# Patient Record
Sex: Female | Born: 1975 | Race: White | Hispanic: No | Marital: Married | Smoking: Never smoker
Health system: Southern US, Community
[De-identification: ages and names within clinical notes are randomized; demographics above are authoritative.]

---

## 2009-02-04 ENCOUNTER — Observation Stay: Payer: Self-pay

## 2009-02-14 ENCOUNTER — Inpatient Hospital Stay: Payer: Self-pay

## 2009-02-26 ENCOUNTER — Ambulatory Visit: Payer: Self-pay

## 2009-05-28 ENCOUNTER — Ambulatory Visit: Payer: Self-pay | Admitting: Family Medicine

## 2012-05-19 ENCOUNTER — Ambulatory Visit: Payer: Self-pay

## 2013-09-27 ENCOUNTER — Ambulatory Visit: Payer: Self-pay | Admitting: Family Medicine

## 2013-12-08 ENCOUNTER — Ambulatory Visit: Payer: Self-pay

## 2013-12-08 LAB — CBC WITH DIFFERENTIAL/PLATELET
Basophil #: 0 10*3/uL (ref 0.0–0.1)
Basophil %: 0.4 %
EOS ABS: 0.1 10*3/uL (ref 0.0–0.7)
Eosinophil %: 1.7 %
HCT: 36.7 % (ref 35.0–47.0)
HGB: 12.2 g/dL (ref 12.0–16.0)
Lymphocyte #: 1.7 10*3/uL (ref 1.0–3.6)
Lymphocyte %: 26.8 %
MCH: 30.4 pg (ref 26.0–34.0)
MCHC: 33.3 g/dL (ref 32.0–36.0)
MCV: 91 fL (ref 80–100)
Monocyte #: 0.5 x10 3/mm (ref 0.2–0.9)
Monocyte %: 8.2 %
Neutrophil #: 3.9 10*3/uL (ref 1.4–6.5)
Neutrophil %: 62.9 %
Platelet: 161 10*3/uL (ref 150–440)
RBC: 4.03 10*6/uL (ref 3.80–5.20)
RDW: 13 % (ref 11.5–14.5)
WBC: 6.2 10*3/uL (ref 3.6–11.0)

## 2013-12-08 LAB — COMPREHENSIVE METABOLIC PANEL
ALT: 19 U/L (ref 12–78)
ANION GAP: 4 — AB (ref 7–16)
Albumin: 3.6 g/dL (ref 3.4–5.0)
Alkaline Phosphatase: 53 U/L
BUN: 14 mg/dL (ref 7–18)
Bilirubin,Total: 0.4 mg/dL (ref 0.2–1.0)
CALCIUM: 9 mg/dL (ref 8.5–10.1)
CHLORIDE: 105 mmol/L (ref 98–107)
CREATININE: 0.87 mg/dL (ref 0.60–1.30)
Co2: 29 mmol/L (ref 21–32)
EGFR (African American): >60
GLUCOSE: 90 mg/dL (ref 65–99)
OSMOLALITY: 276 (ref 275–301)
Potassium: 3.9 mmol/L (ref 3.5–5.1)
SGOT(AST): 16 U/L (ref 15–37)
SODIUM: 138 mmol/L (ref 136–145)
Total Protein: 6.6 g/dL (ref 6.4–8.2)

## 2013-12-08 LAB — URINALYSIS, COMPLETE
Bilirubin,UR: NEGATIVE
Blood: NEGATIVE
Glucose,UR: NEGATIVE mg/dL (ref 0–75)
KETONE: NEGATIVE
NITRITE: NEGATIVE
PH: 6 (ref 4.5–8.0)
Protein: NEGATIVE
SPECIFIC GRAVITY: 1.01 (ref 1.003–1.030)

## 2013-12-08 LAB — LIPASE, BLOOD: LIPASE: 149 U/L (ref 73–393)

## 2013-12-08 LAB — AMYLASE: Amylase: 54 U/L (ref 25–115)

## 2013-12-10 LAB — URINE CULTURE

## 2013-12-12 ENCOUNTER — Ambulatory Visit: Payer: Self-pay

## 2013-12-14 IMAGING — US US EXTREM LOW VENOUS*R*
1 series · 14 of 24 positions shown · non-contrast
Comparison: none

REASON FOR EXAM: CALL REPORT [DATE] Right calf pain Eval for DVT
COMMENTS:

[Series 1: us extrem low venous*right* · 0.07mm/px · 14 of 30 slices shown]
[im 1/30]
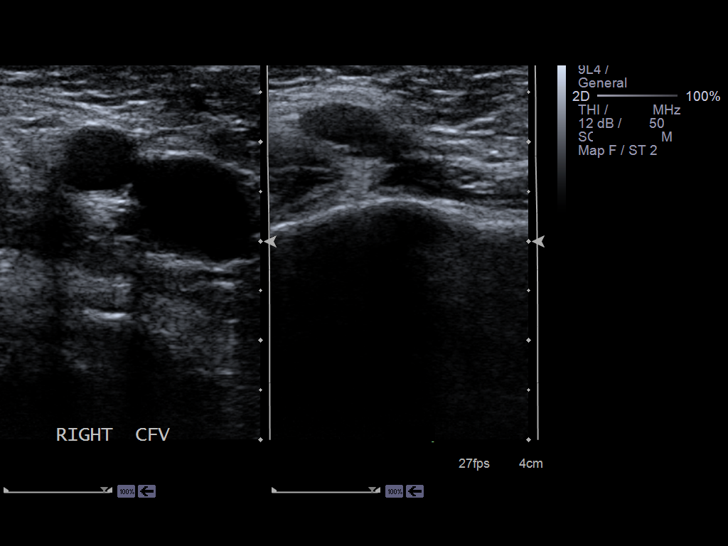
[im 3/30]
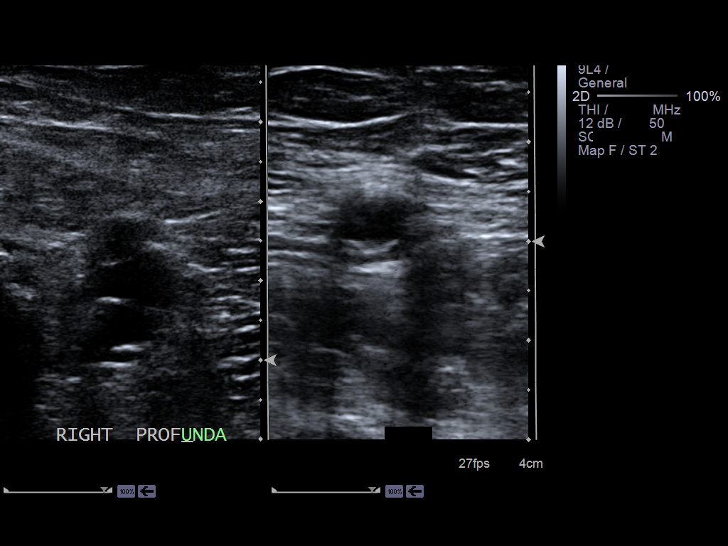
[im 6/30]
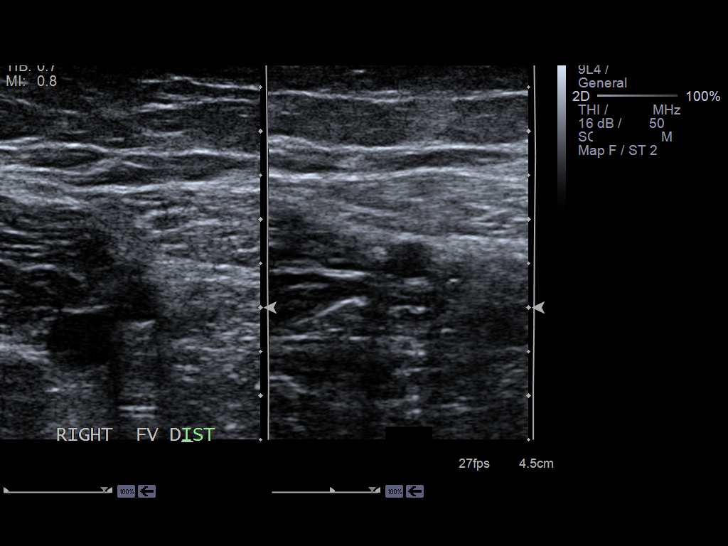
[im 8/30]
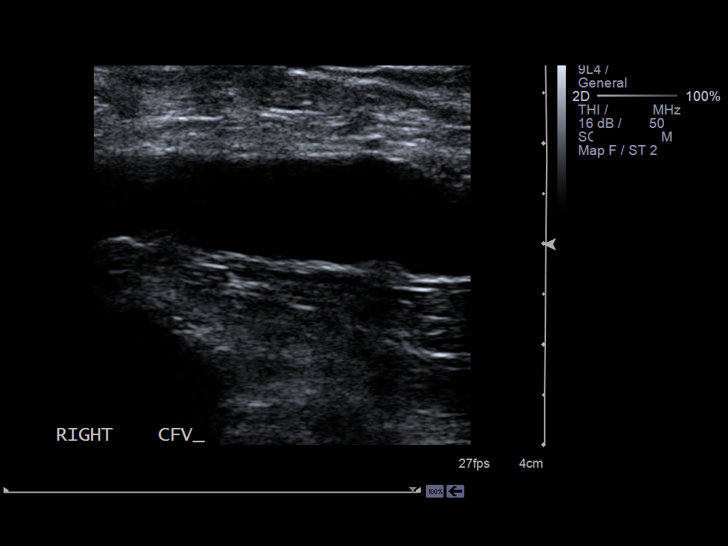
[im 9/30]
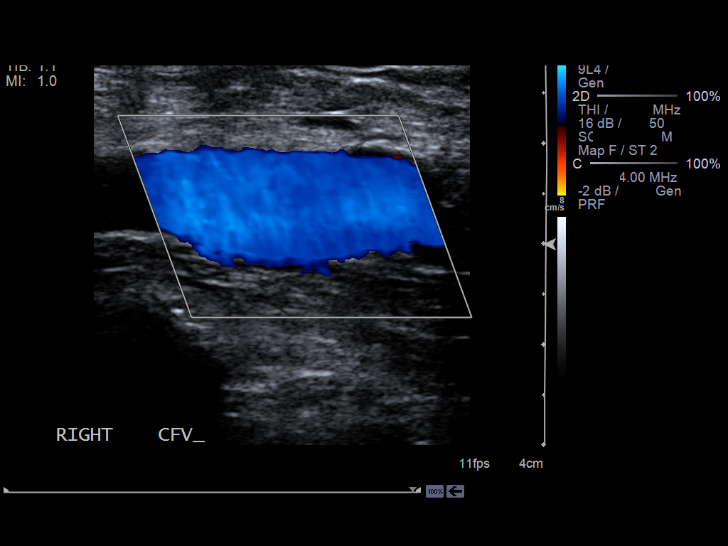
[im 12/30]
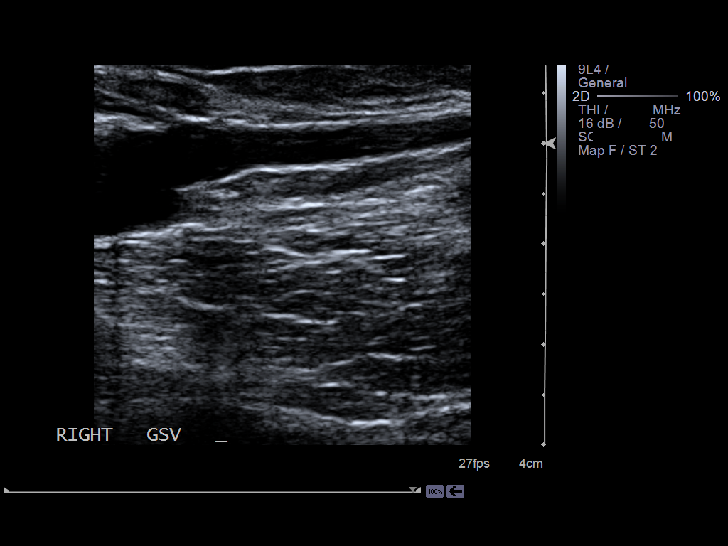
[im 14/30]
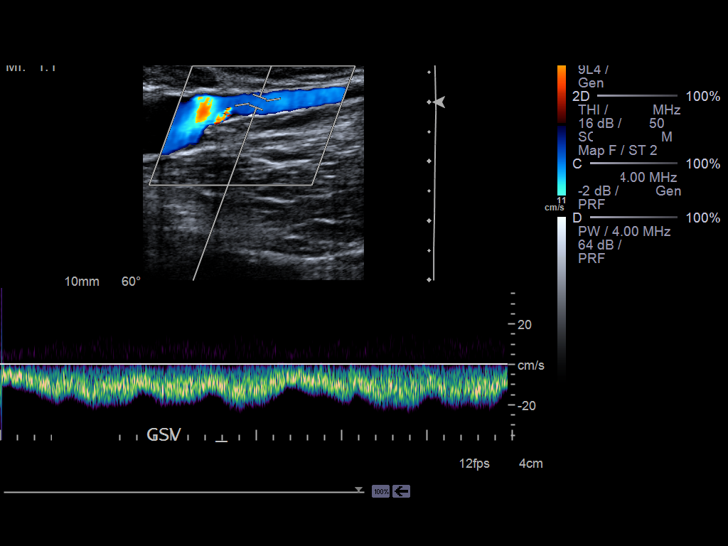
[im 16/30]
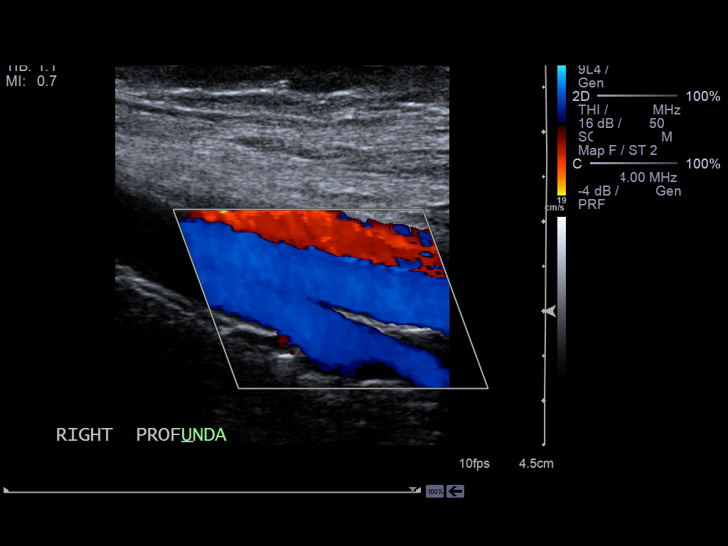
[im 18/30]
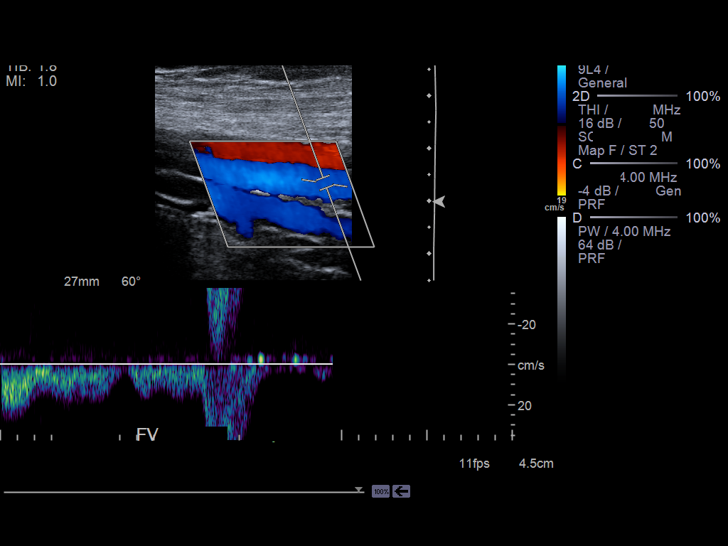
[im 21/30]
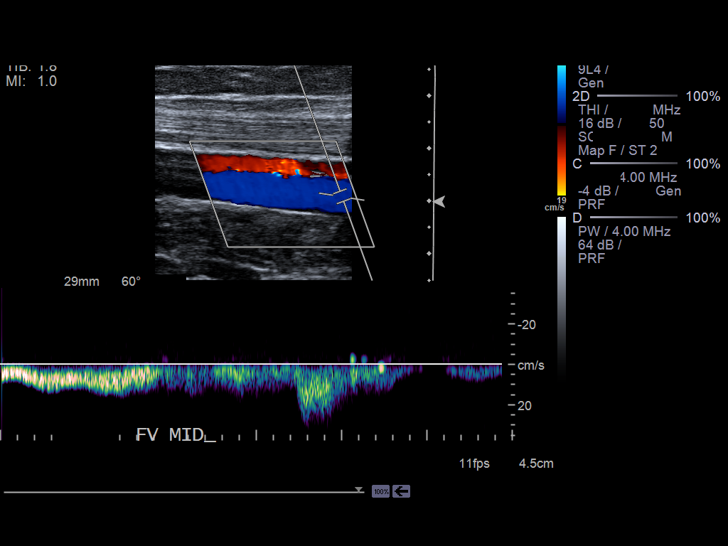
[im 23/30]
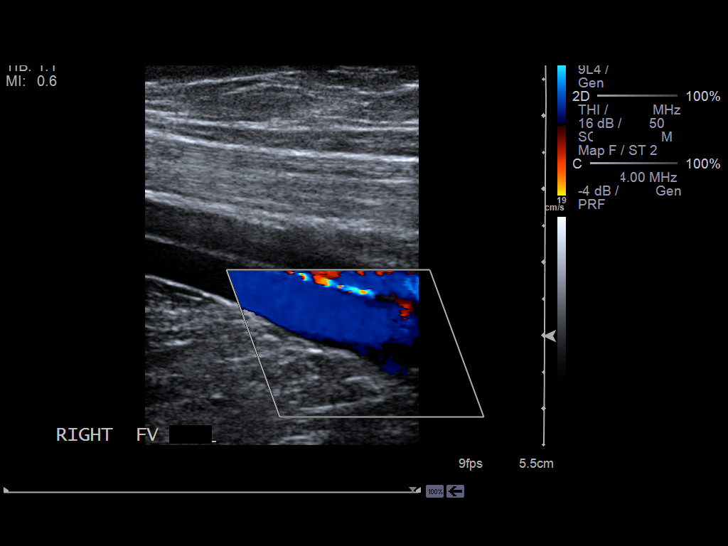
[im 24/30]
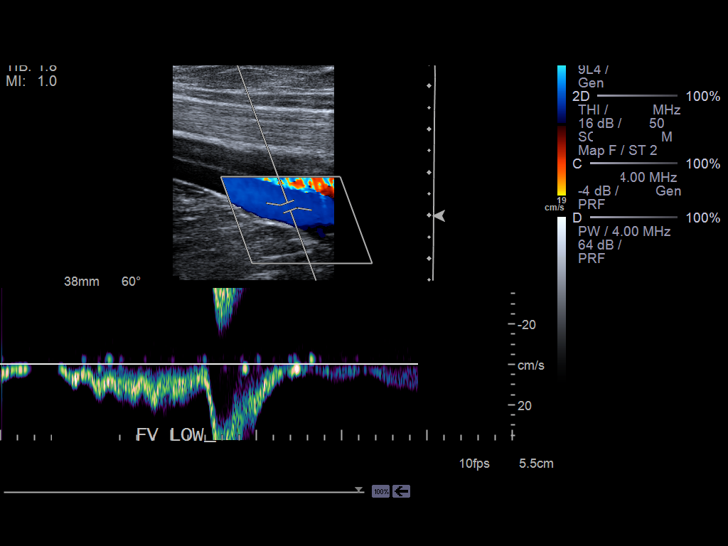
[im 27/30]
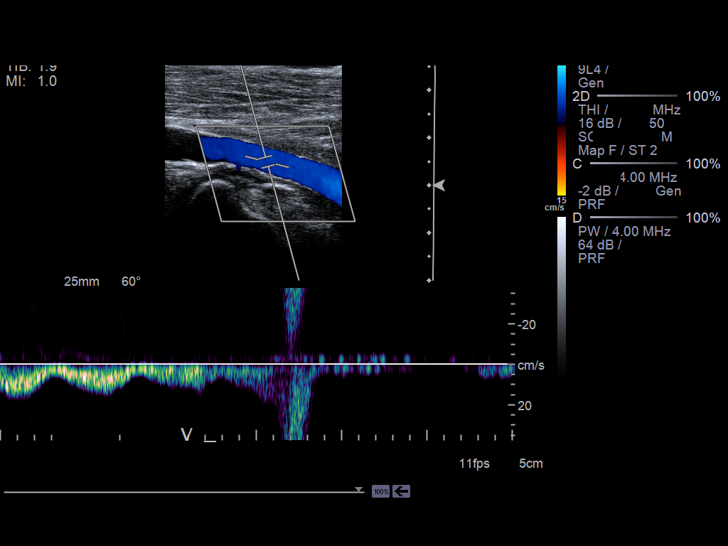
[im 30/30]
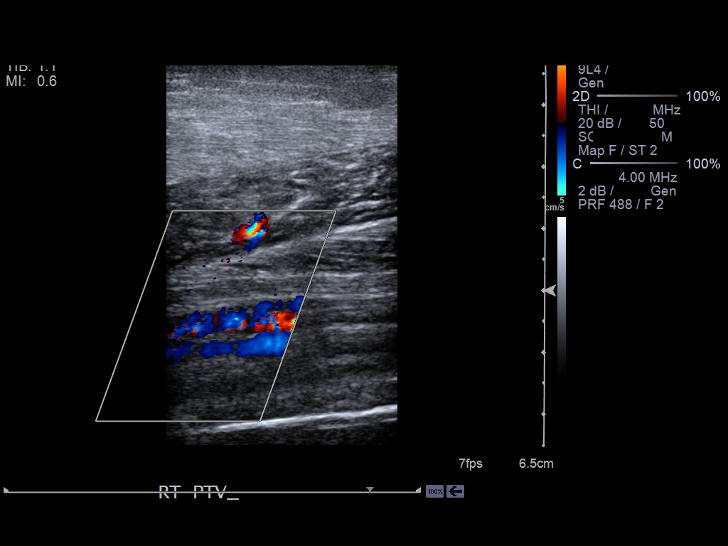

[14 of 24 positions shown; findings below may reference images not displayed]

PROCEDURE:     US  - US DOPPLER LOW EXTR RIGHT  - May 19, 2012  [DATE]

RESULT:     Doppler interrogation of the deep venous system of the right leg
is performed from the common femoral vein through the popliteal vein. Study
demonstrates normal compressibility of the deep venous structures. The color
Doppler and spectral Doppler appearance is normal.
IMPRESSION: 1. No evidence of right lower extremity deep vein thrombosis.

[REDACTED]

## 2015-07-09 IMAGING — US ABDOMEN ULTRASOUND LIMITED
1 series · 14 of 25 positions shown · non-contrast
Comparison: None.

CLINICAL DATA: Abdominal pain, right flank pain, evaluate
gallbladder and liver

EXAM:
US ABDOMEN LIMITED - RIGHT UPPER QUADRANT

[Series 1: abdomen ultrasound limited · 0.33mm/px · 14 of 59 slices shown]
[im 1/59]
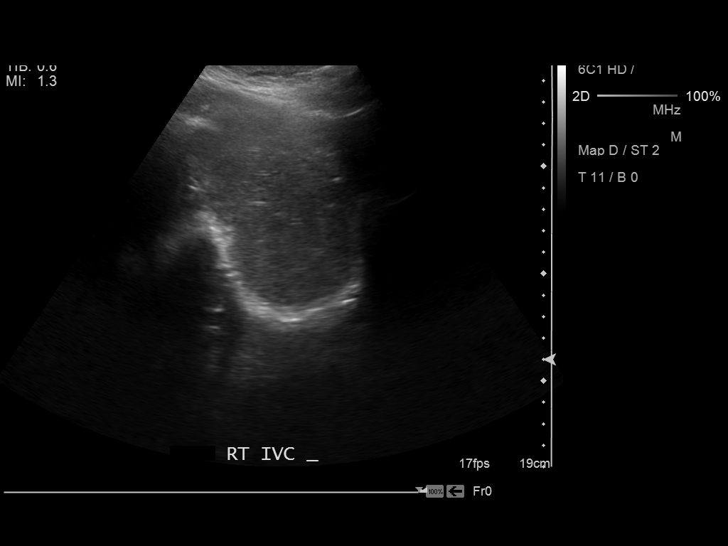
[im 5/59]
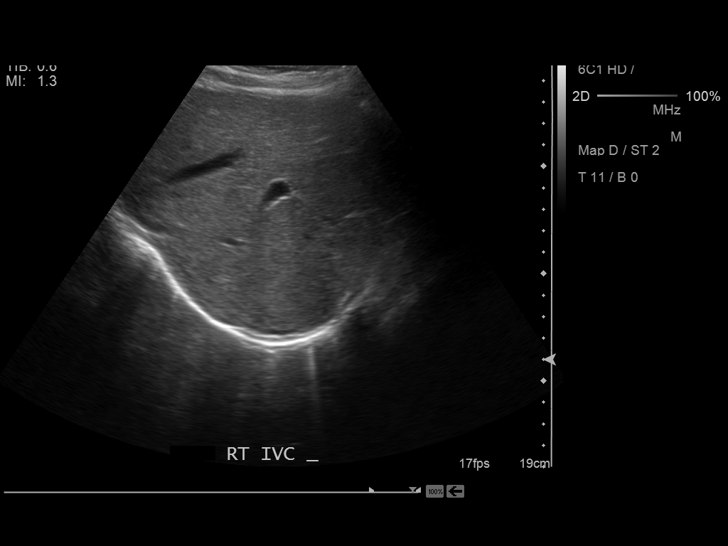
[im 10/59]
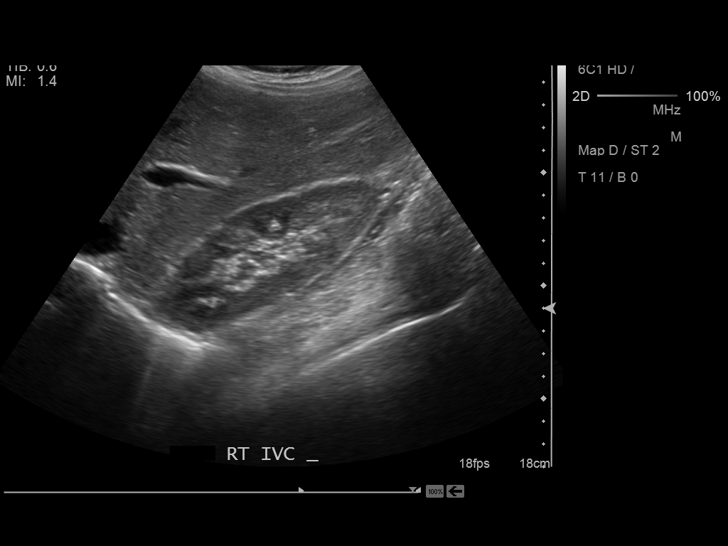
[im 15/59]
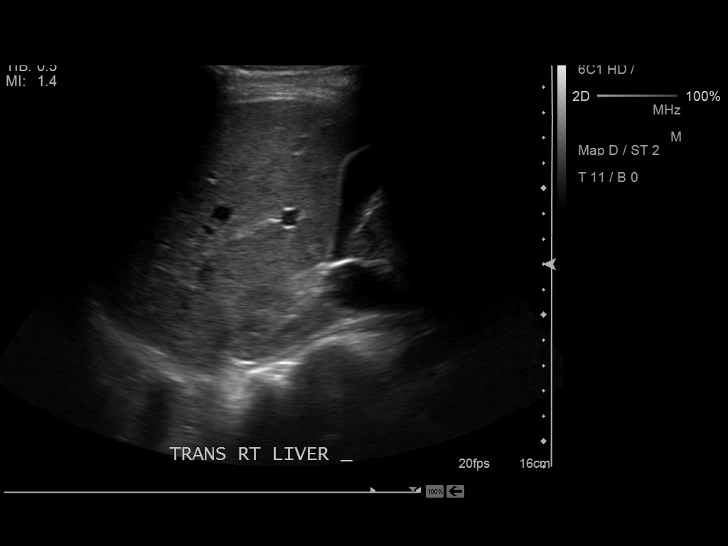
[im 20/59]
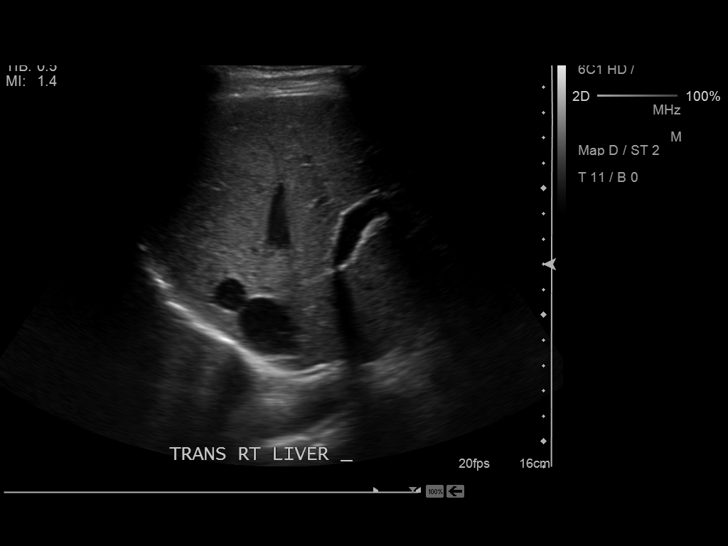
[im 22/59]
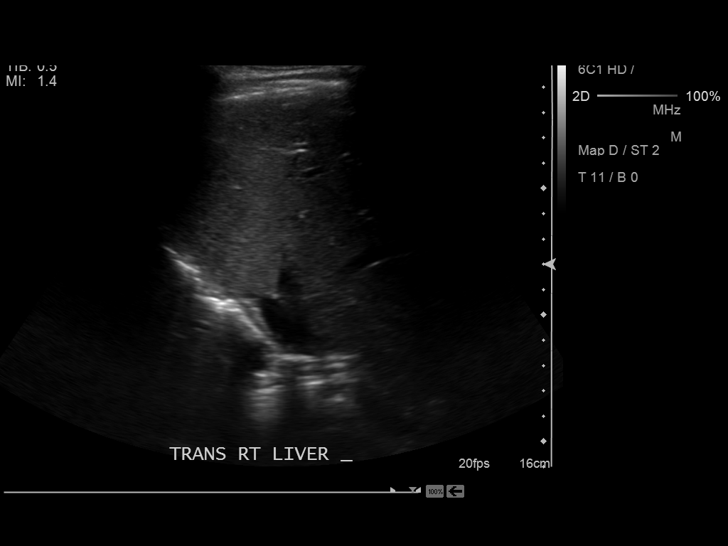
[im 27/59]
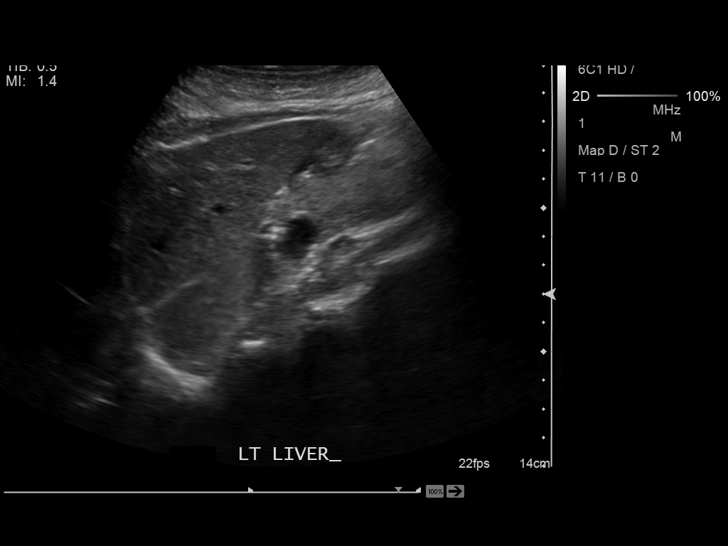
[im 32/59]
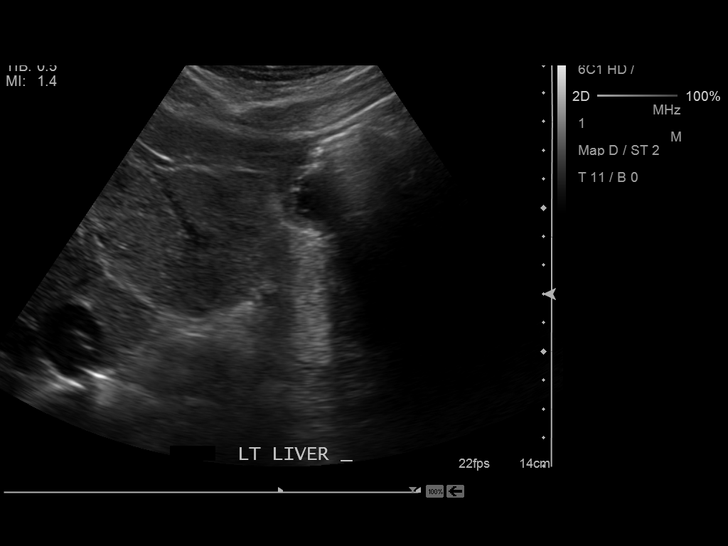
[im 37/59]
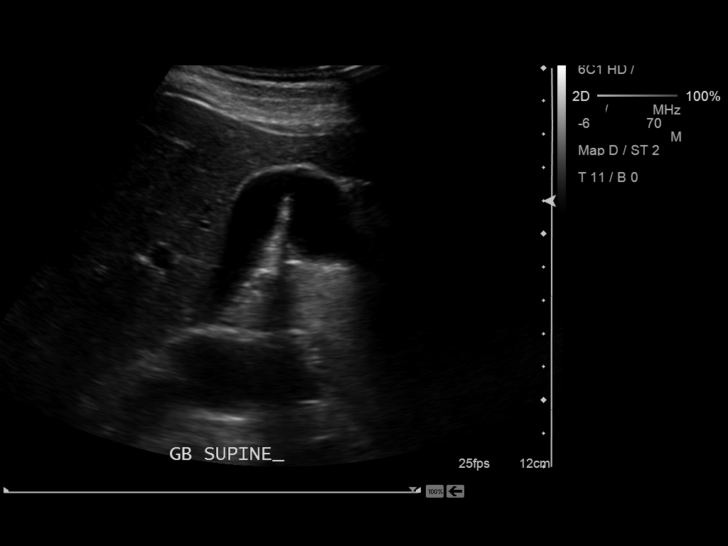
[im 39/59]
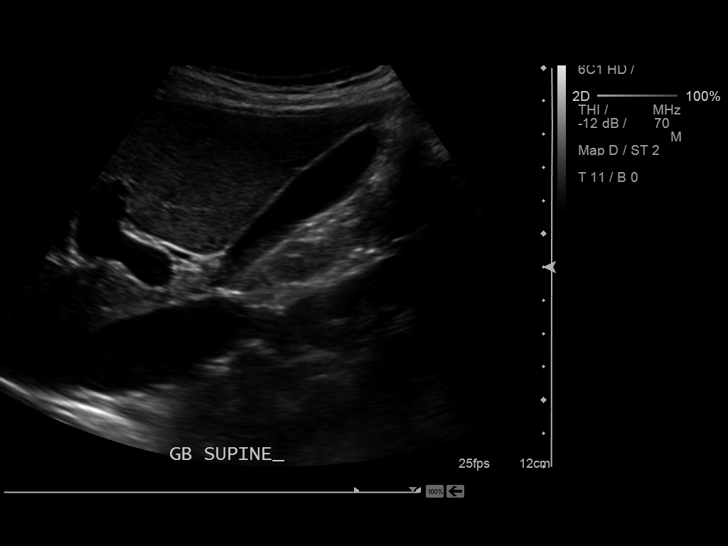
[im 44/59]
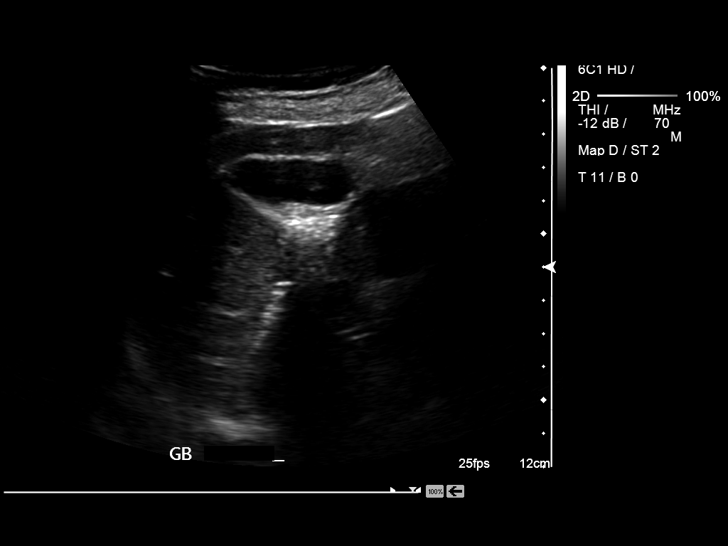
[im 49/59]
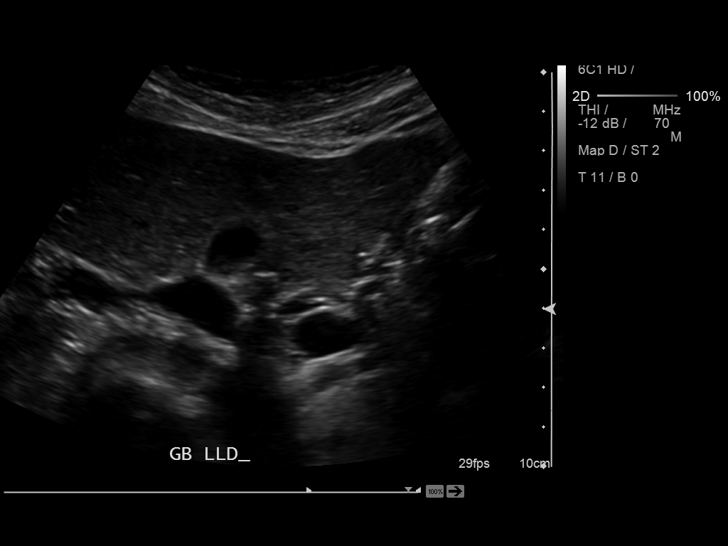
[im 54/59]
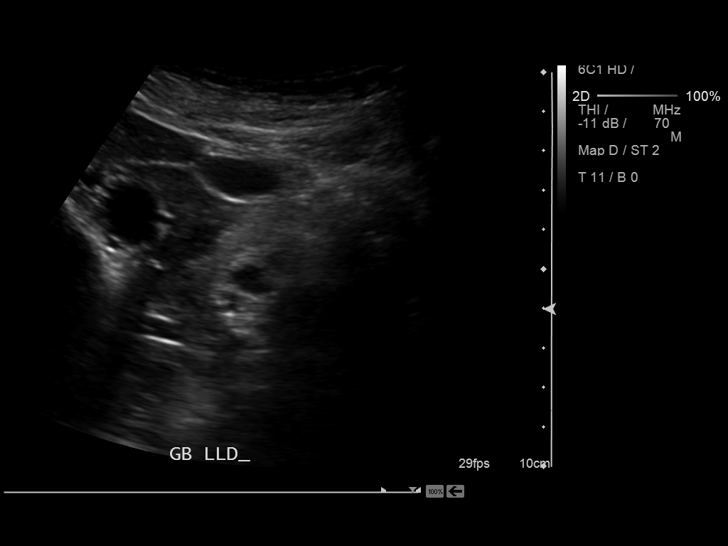
[im 59/59]
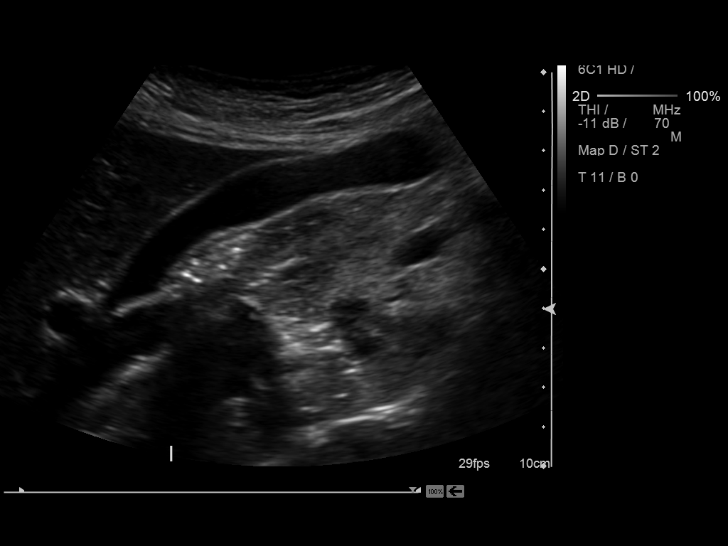

[14 of 25 positions shown; findings below may reference images not displayed]

FINDINGS: Gallbladder:

No gallstones or wall thickening visualized. No sonographic Murphy
sign noted.

Common bile duct:

Diameter: 5 mm in diameter within normal limits.

Liver:

No focal lesion identified. Within normal limits in parenchymal
echogenicity.
IMPRESSION: Unremarkable right upper quadrant ultrasound.

## 2016-06-11 ENCOUNTER — Ambulatory Visit
Admission: EM | Admit: 2016-06-11 | Discharge: 2016-06-11 | Disposition: A | Discharge status: Home / Self Care | Payer: Managed Care, Other (non HMO) | Attending: Emergency Medicine | Admitting: Emergency Medicine

## 2016-06-11 ENCOUNTER — Encounter: Payer: Self-pay | Admitting: Emergency Medicine

## 2016-06-11 DIAGNOSIS — J0100 Acute maxillary sinusitis, unspecified: Secondary | ICD-10-CM

## 2016-06-11 DIAGNOSIS — R0989 Other specified symptoms and signs involving the circulatory and respiratory systems: Secondary | ICD-10-CM | POA: Diagnosis not present

## 2016-06-11 DIAGNOSIS — H9202 Otalgia, left ear: Secondary | ICD-10-CM | POA: Diagnosis not present

## 2016-06-11 DIAGNOSIS — R079 Chest pain, unspecified: Secondary | ICD-10-CM | POA: Diagnosis not present

## 2016-06-11 DIAGNOSIS — R0981 Nasal congestion: Secondary | ICD-10-CM | POA: Diagnosis not present

## 2016-06-11 DIAGNOSIS — M26623 Arthralgia of bilateral temporomandibular joint: Secondary | ICD-10-CM

## 2016-06-11 DIAGNOSIS — R0789 Other chest pain: Secondary | ICD-10-CM

## 2016-06-11 MED ORDER — CYCLOBENZAPRINE HCL 10 MG PO TABS: 10.0000 mg | ORAL_TABLET | Freq: Every day | ORAL | 0 refills | Status: AC

## 2016-06-11 MED ORDER — MOMETASONE FUROATE 50 MCG/ACT NA SUSP: 2.0000 | Freq: Every day | NASAL | 0 refills | Status: AC

## 2016-06-11 MED ORDER — NAPROXEN 500 MG PO TABS: 500.0000 mg | ORAL_TABLET | Freq: Two times a day (BID) | ORAL | 0 refills | Status: AC

## 2016-06-11 NOTE — Discharge Instructions (Signed)
Chest pain: You can try some Pepcid or some Protonix in case this is acid reflux. Go immediately to the ER for a return of your symptoms.  URI/sinusitis; Mucinex D, saline nasal irrigation, Naprosyn 500 mg with 1 g of Tylenol twice a day. This will also help with your temporomandibular joint. This is an effective combination for pain.  Most sinus infections are viral and do not need antibiotics unless you have a high fever, have had this for 10 days, or you get better and then get sick again. Use a neti pot or the NeilMed sinus rinse as often as you want to to reduce nasal congestion. Follow the directions on the box.   TMJ pain: Soft diet for the next few days. Do not chew gum or eat foods that require a lot of chewing. You can try the Flexeril at night.

## 2016-06-11 NOTE — ED Triage Notes (Signed)
Patient c/o runny nose, sinus congestion and left ear pain for the past 2 days.  Patient denies fevers.

## 2016-06-11 NOTE — ED Provider Notes (Signed)
HPI  SUBJECTIVE:  Katherine Zamora is a 40 y.o. female who presents with 2 complaints. First, she reports an episode of chest pain described as pressure with nausea and diaphoresis 5 days ago. Last approximately hour and then resolves on its own after she lay down. She denies any exertional positional component to this. No abdominal pain, syncope, palpitations. No radiation to her neck, through to her back, down her arm. States that she ate at a restaurant earlier that night, but did not have anything unusual. She states that she has had intermittent substernal nonradiating chest pressure that lasts minutes since. No nausea, diaphoresis, exertional component. No change in physical activity. No trauma to her chest. She reports a mild cough, no wheezing or shortness of breath. There are no aggravating or alleviating factors. She has not tried anything for this.  Second, she reports constant bilateral jaw pain, sinus pressure, nasal congestion, rhinorrhea, postnasal drip, frontal sinus headache and a burning pain in her throat for the past 3 days. She denies fevers, dental pain, wheezing, shortness of breath. No sore throat. No belching, water brash. She has tried hot lemon water, essential oils, ibuprofen 400 mg earlier today. Ibuprofen helped. Symptoms are worse with lying down. She did not get a flu shot this year. No sick contacts. His past medical history strep, Kawasaki disease, GERD and pregnancy. No history of MI, hypertension, hypercholesterolemia, diabetes, PE, DVT, coronary artery disease. She is nonsmoker. She is not any birth control pills. LMP: 11/9. Denies possibility of being pregnant. Family history: Father drowned at age 38 but they found a 90% LAD occlusion on autopsy. It is unknown if he had an MI. PMD: She will be establishing care at [facility] in 2 days.  History reviewed. No pertinent past medical history.  History reviewed. No pertinent surgical history.  History reviewed.  No pertinent family history.  Social History  Substance Use Topics  . Smoking status: Never Smoker  . Smokeless tobacco: Never Used  . Alcohol use No    No current facility-administered medications for this encounter.   Current Outpatient Prescriptions:  .  cyclobenzaprine (FLEXERIL) 10 MG tablet, Take 1 tablet (10 mg total) by mouth at bedtime., Disp: 20 tablet, Rfl: 0 .  mometasone (NASONEX) 50 MCG/ACT nasal spray, Place 2 sprays into the nose daily., Disp: 17 g, Rfl: 0 .  naproxen (NAPROSYN) 500 MG tablet, Take 1 tablet (500 mg total) by mouth 2 (two) times daily., Disp: 20 tablet, Rfl: 0  No Known Allergies   ROS  As noted in HPI.   Physical Exam  BP 117/89 (BP Location: Left Arm)   Pulse 83   Temp 98 F (36.7 C) (Oral)   Resp 16   Ht 5\' 9"  (1.753 m)   Wt 128 lb (58.1 kg)   LMP 05/13/2016 (Exact Date)   SpO2 100%   BMI 18.90 kg/m   Constitutional: Well developed, well nourished, no acute distress Eyes: PERRL, EOMI, conjunctiva normal bilaterally HENT: Normocephalic, atraumatic,mucus membranes moist. TMs, external ear canals normal bilaterally. Positive bilateral TMJ tenderness with crepitus. Positive nasal congestion. Positive frontal sinus tenderness, mild. Normal turbinates. Dentition normal. Positive cobblestoning. No obvious postnasal drip. Respiratory: Clear to auscultation bilaterally, no rales, no wheezing, no rhonchi Cardiovascular: Normal rate and rhythm, no murmurs, no gallops, no rubs. No chest wall tenderness GI: nondistended, skin: No rash, skin intact Musculoskeletal: Calves symmetric, nontender No edema, no tenderness, no deformities Neurologic: Alert & oriented x 3, CN II-XII grossly intact, no  motor deficits, sensation grossly intact Psychiatric: Speech and behavior appropriate   ED Course   Medications - No data to display  Orders Placed This Encounter  Procedures  . ED EKG    Standing Status:   Standing    Number of Occurrences:   1     Order Specific Question:   Reason for Exam    Answer:   Chest Pain  . EKG 12-Lead    Standing Status:   Standing    Number of Occurrences:   1   No results found for this or any previous visit (from the past 24 hour(s)). No results found.  ED Clinical Impression  Acute maxillary sinusitis, recurrence not specified  Chest pressure  Bilateral temporomandibular joint pain   ED Assessment/Plan   EKG: Normal sinus rhythm, rate 85. Normal axis, normal intervals. No hypertrophy. No ST-T wave changes. No previous EKG for comparison. Patient was asymptomatic while this was obtained.  Concern for cardiac etiology of her chest pain given her history of Kawasaki disease, however, as she has no EKG changes here and is asymptomatic at this point in time, feel that she is stable to do an outpatient cardiac workup. This could be simple reflux given that she does have a history of GERD in pregnancy.  [facility]. Giving patient strict ER chest pain return precautions. She is to go immediately to the ER for any recurrence of her symptoms.  Second, presentation consistent with URI/viral sinusitis. Feel that the ear pain and jaw pain is most likely from TMJ arthralgia given that she has tenderness and crepitus, and is not an anginal equivalent . She is to do a soft diet for the next few days. Flexeril at night.   She does have some frontal sinus tenderness and nasal congestion, but in the absence of fevers, upper dental pain, early nasal drainage, there are no indications for antibiotics. We'll send home with Nasonex, saline nasal irrigation, ibuprofen 600 mg with 1 g of Tylenol 4 times a day.   Discussed MDM, plan and followup with patient. Discussed sn/sx that should prompt return to the ED. Patient agrees with plan.   Meds ordered this encounter  Medications  . cyclobenzaprine (FLEXERIL) 10 MG tablet    Sig: Take 1 tablet (10 mg total) by mouth at  bedtime.    Dispense:  20 tablet    Refill:  0  . naproxen (NAPROSYN) 500 MG tablet    Sig: Take 1 tablet (500 mg total) by mouth 2 (two) times daily.    Dispense:  20 tablet    Refill:  0  . mometasone (NASONEX) 50 MCG/ACT nasal spray    Sig: Place 2 sprays into the nose daily.    Dispense:  17 g    Refill:  0    *This clinic note was created using Scientist, clinical (histocompatibility and immunogenetics)Dragon dictation software. Therefore, there may be occasional mistakes despite careful proofreading.  ?   Domenick GongAshley Tamika Shropshire, MD 06/17/16 1007

## 2016-06-14 ENCOUNTER — Telehealth: Payer: Self-pay

## 2016-06-14 NOTE — Telephone Encounter (Signed)
Courtesy call back completed today for patient's recent visit at [facility]. Patient did not answer, left message on machine to call back with any questions or concerns.   

## 2022-02-09 ENCOUNTER — Other Ambulatory Visit: Payer: Self-pay

## 2022-02-09 ENCOUNTER — Emergency Department: Payer: Managed Care, Other (non HMO)

## 2022-02-09 ENCOUNTER — Emergency Department
Admission: EM | Admit: 2022-02-09 | Discharge: 2022-02-09 | Disposition: A | Discharge status: Home / Self Care | Payer: Managed Care, Other (non HMO) | Attending: Emergency Medicine | Admitting: Emergency Medicine

## 2022-02-09 DIAGNOSIS — R109 Unspecified abdominal pain: Secondary | ICD-10-CM | POA: Diagnosis present

## 2022-02-09 DIAGNOSIS — N132 Hydronephrosis with renal and ureteral calculous obstruction: Secondary | ICD-10-CM | POA: Diagnosis not present

## 2022-02-09 DIAGNOSIS — N200 Calculus of kidney: Secondary | ICD-10-CM

## 2022-02-09 LAB — CBC
HCT: 40.8 % (ref 36.0–46.0)
Hemoglobin: 13.7 g/dL (ref 12.0–15.0)
MCH: 30.2 pg (ref 26.0–34.0)
MCHC: 33.6 g/dL (ref 30.0–36.0)
MCV: 90.1 fL (ref 80.0–100.0)
Platelets: 238 10*3/uL (ref 150–400)
RBC: 4.53 MIL/uL (ref 3.87–5.11)
RDW: 12.1 % (ref 11.5–15.5)
WBC: 7.2 10*3/uL (ref 4.0–10.5)
nRBC: 0 % (ref 0.0–0.2)

## 2022-02-09 LAB — URINALYSIS, ROUTINE W REFLEX MICROSCOPIC
Bilirubin Urine: NEGATIVE
Glucose, UA: NEGATIVE mg/dL
Ketones, ur: 20 mg/dL — AB
Leukocytes,Ua: NEGATIVE
Nitrite: NEGATIVE
Protein, ur: NEGATIVE mg/dL
Specific Gravity, Urine: 1.023 (ref 1.005–1.030)
pH: 5 (ref 5.0–8.0)

## 2022-02-09 LAB — BASIC METABOLIC PANEL
Anion gap: 8 (ref 5–15)
BUN: 15 mg/dL (ref 6–20)
CO2: 22 mmol/L (ref 22–32)
Calcium: 9.5 mg/dL (ref 8.9–10.3)
Chloride: 107 mmol/L (ref 98–111)
Creatinine, Ser: 1.03 mg/dL — ABNORMAL HIGH (ref 0.44–1.00)
GFR, Estimated: >60 mL/min (ref >60)
Glucose, Bld: 118 mg/dL — ABNORMAL HIGH (ref 70–99)
Potassium: 3.6 mmol/L (ref 3.5–5.1)
Sodium: 137 mmol/L (ref 135–145)

## 2022-02-09 LAB — HCG, QUANTITATIVE, PREGNANCY: hCG, Beta Chain, Quant, S: 1 m[IU]/mL (ref <5)

## 2022-02-09 MED ORDER — ONDANSETRON HCL 4 MG PO TABS: 4.0000 mg | ORAL_TABLET | Freq: Three times a day (TID) | ORAL | 0 refills | Status: AC | PRN

## 2022-02-09 MED ORDER — TAMSULOSIN HCL 0.4 MG PO CAPS: 0.4000 mg | ORAL_CAPSULE | Freq: Every day | ORAL | 0 refills | Status: AC

## 2022-02-09 MED ORDER — KETOROLAC TROMETHAMINE 10 MG PO TABS: 10.0000 mg | ORAL_TABLET | Freq: Three times a day (TID) | ORAL | 0 refills | Status: AC | PRN

## 2022-02-09 MED ORDER — KETOROLAC TROMETHAMINE 15 MG/ML IJ SOLN
15.0000 mg | Freq: Once | INTRAMUSCULAR | Status: AC
  Administered 2022-02-09: 15 mg via INTRAVENOUS
  Filled 2022-02-09: qty 1

## 2022-02-09 MED ORDER — FENTANYL CITRATE PF 50 MCG/ML IJ SOSY
50.0000 ug | PREFILLED_SYRINGE | Freq: Once | INTRAMUSCULAR | Status: AC
  Administered 2022-02-09: 50 ug via INTRAVENOUS
  Filled 2022-02-09: qty 1

## 2022-02-09 MED ORDER — MORPHINE SULFATE (PF) 4 MG/ML IV SOLN
4.0000 mg | Freq: Once | INTRAVENOUS | Status: AC
  Administered 2022-02-09: 4 mg via INTRAVENOUS
  Filled 2022-02-09: qty 1

## 2022-02-09 MED ORDER — SODIUM CHLORIDE 0.9 % IV BOLUS
1000.0000 mL | Freq: Once | INTRAVENOUS | Status: AC
  Administered 2022-02-09: 1000 mL via INTRAVENOUS

## 2022-02-09 MED ORDER — ONDANSETRON HCL 4 MG/2ML IJ SOLN
4.0000 mg | Freq: Once | INTRAMUSCULAR | Status: AC
  Administered 2022-02-09: 4 mg via INTRAVENOUS
  Filled 2022-02-09: qty 2

## 2022-02-09 MED ORDER — TAMSULOSIN HCL 0.4 MG PO CAPS
0.4000 mg | ORAL_CAPSULE | Freq: Once | ORAL | Status: AC
  Administered 2022-02-09: 0.4 mg via ORAL
  Filled 2022-02-09: qty 1

## 2022-02-09 NOTE — Discharge Instructions (Signed)
Please seek medical attention for any high fevers, chest pain, shortness of breath, change in behavior, persistent vomiting, bloody stool or any other new or concerning symptoms.  

## 2022-02-09 NOTE — ED Notes (Signed)
Bladder scan result: 3 ml.

## 2022-02-09 NOTE — ED Provider Notes (Signed)
[facility] Provider Note    Event Date/Time   First MD Initiated Contact with Patient 02/09/22 1942     (approximate)   History   Flank Pain   HPI  Katherine Zamora is a 46 y.o. female  who presents to the emergency department today because of concern for urgency, right flank pain. Symptoms started today. Have progressively gotten worse. The pain is severe. Both burning and stabbing in nature. The patient denies similar symptoms in the past.  She has also developed nausea and vomiting.   Physical Exam   Triage Vital Signs: ED Triage Vitals  Enc Vitals Group     BP 02/09/22 1921 136/75     Pulse Rate 02/09/22 1921 (!) 55     Resp 02/09/22 1921 20     Temp 02/09/22 1921 (!) 97.5 F (36.4 C)     Temp Source 02/09/22 1921 Oral     SpO2 02/09/22 1921 99 %     Weight 02/09/22 1923 138 lb (62.6 kg)     Height 02/09/22 1923 5\' 9"  (1.753 m)     Head Circumference --      Peak Flow --      Pain Score 02/09/22 1922 8   Most recent vital signs: Vitals:   02/09/22 1921  BP: 136/75  Pulse: (!) 55  Resp: 20  Temp: (!) 97.5 F (36.4 C)  SpO2: 99%    General: Awake, alert, oriented. CV:  Good peripheral perfusion.  Resp:  Normal effort.  Abd:  No distention. Non tender.    ED Results / Procedures / Treatments   Labs (all labs ordered are listed, but only abnormal results are displayed) Labs Reviewed  URINALYSIS, ROUTINE W REFLEX MICROSCOPIC - Abnormal; Notable for the following components:      Result Value   Color, Urine YELLOW (*)    APPearance CLOUDY (*)    Hgb urine dipstick MODERATE (*)    Ketones, ur 20 (*)    Bacteria, UA RARE (*)    All other components within normal limits  BASIC METABOLIC PANEL - Abnormal; Notable for the following components:   Glucose, Bld 118 (*)    Creatinine, Ser 1.03 (*)    All other components within normal limits  CBC  HCG, QUANTITATIVE, PREGNANCY  POC URINE PREG, ED      EKG  None   RADIOLOGY I independently interpreted and visualized the ct renal. My interpretation: kidney stone at right uvj Radiology interpretation:  IMPRESSION:  1. Obstructive 3.6 mm calculus at the right vesicoureteral junction,  likely within the intravesicular portion of the right terminal  ureter, with moderate right hydronephrosis and hydroureter.  2. Small hiatal hernia.  3. Increased attenuation of the gallbladder may represent sludge.     PROCEDURES:  Critical Care performed: No  Procedures   MEDICATIONS ORDERED IN ED: Medications - No data to display   IMPRESSION / MDM / ASSESSMENT AND PLAN / ED COURSE  I reviewed the triage vital signs and the nursing notes.                              Differential diagnosis includes, but is not limited to, kidney stone, appendicitis, urinary tract infection  Patient's presentation is most consistent with acute presentation with potential threat to life or bodily function.  Patient presented to the emergency department today because of concerns for right flank pain.  Patient's abdomen  is benign.  Given clinical history and patient's appearance did have concerns for kidney stone.  CT scan was obtained which did show a stone at the right UVJ.  Urine without findings concerning for infection.  I did discuss the findings with the patient.  She did have significant pain relief with IV fluids and medication here in the emergency department.  We will plan on discharging with prescription for pain medication nausea medication and Flomax.  Discussed return precautions.  FINAL CLINICAL IMPRESSION(S) / ED DIAGNOSES   Final diagnoses:  Kidney stone     Note:  This document was prepared using Dragon voice recognition software and may include unintentional dictation errors.    Phineas Semen, MD 02/09/22 [phone]

## 2022-02-09 NOTE — ED Triage Notes (Signed)
Patient reports she has been unable to urinate all day and reports sudden onset of right flank pain that began shortly before arriving to ED. Denies fever, reports chills. AOX4. Resp even, unlabored on RA, ambulatory.

## 2022-03-21 ENCOUNTER — Ambulatory Visit
Admission: RE | Admit: 2022-03-21 | Discharge: 2022-03-21 | Disposition: A | Discharge status: Home / Self Care | Payer: Managed Care, Other (non HMO) | Source: Ambulatory Visit | Attending: Family

## 2022-03-21 ENCOUNTER — Ambulatory Visit
Admission: RE | Admit: 2022-03-21 | Discharge: 2022-03-21 | Disposition: A | Discharge status: Home / Self Care | Payer: Managed Care, Other (non HMO) | Attending: Family | Admitting: Family

## 2022-03-21 ENCOUNTER — Other Ambulatory Visit: Payer: Self-pay | Admitting: Family

## 2022-03-21 DIAGNOSIS — R051 Acute cough: Secondary | ICD-10-CM | POA: Diagnosis present

## 2024-06-06 DIAGNOSIS — R10A2 Flank pain, left side: Secondary | ICD-10-CM | POA: Insufficient documentation

## 2024-06-06 DIAGNOSIS — R10A1 Flank pain, right side: Secondary | ICD-10-CM | POA: Diagnosis present

## 2024-06-06 DIAGNOSIS — R1031 Right lower quadrant pain: Secondary | ICD-10-CM | POA: Insufficient documentation

## 2024-06-07 ENCOUNTER — Emergency Department

## 2024-06-07 ENCOUNTER — Emergency Department: Admission: EM | Admit: 2024-06-07 | Discharge: 2024-06-07 | Disposition: A | Payer: Self-pay

## 2024-06-07 ENCOUNTER — Other Ambulatory Visit: Payer: Self-pay

## 2024-06-07 DIAGNOSIS — R10A2 Flank pain, left side: Secondary | ICD-10-CM

## 2024-06-07 LAB — URINALYSIS, ROUTINE W REFLEX MICROSCOPIC
Bilirubin Urine: NEGATIVE
Glucose, UA: NEGATIVE mg/dL
Ketones, ur: 5 mg/dL — AB
Leukocytes,Ua: NEGATIVE
Nitrite: NEGATIVE
Protein, ur: 30 mg/dL — AB
RBC / HPF: >50 RBC/hpf (ref 0–5)
Specific Gravity, Urine: 1.031 — ABNORMAL HIGH (ref 1.005–1.030)
pH: 5 (ref 5.0–8.0)

## 2024-06-07 LAB — CBC WITH DIFFERENTIAL/PLATELET
Abs Immature Granulocytes: 0.01 K/uL (ref 0.00–0.07)
Basophils Absolute: 0 K/uL (ref 0.0–0.1)
Basophils Relative: 0 %
Eosinophils Absolute: 0.3 K/uL (ref 0.0–0.5)
Eosinophils Relative: 3 %
HCT: 36.9 % (ref 36.0–46.0)
Hemoglobin: 12.4 g/dL (ref 12.0–15.0)
Immature Granulocytes: 0 %
Lymphocytes Relative: 32 %
Lymphs Abs: 2.3 K/uL (ref 0.7–4.0)
MCH: 30.2 pg (ref 26.0–34.0)
MCHC: 33.6 g/dL (ref 30.0–36.0)
MCV: 90 fL (ref 80.0–100.0)
Monocytes Absolute: 0.7 K/uL (ref 0.1–1.0)
Monocytes Relative: 9 %
Neutro Abs: 4 K/uL (ref 1.7–7.7)
Neutrophils Relative %: 56 %
Platelets: 215 K/uL (ref 150–400)
RBC: 4.1 MIL/uL (ref 3.87–5.11)
RDW: 13.1 % (ref 11.5–15.5)
WBC: 7.3 K/uL (ref 4.0–10.5)
nRBC: 0 % (ref 0.0–0.2)

## 2024-06-07 LAB — BASIC METABOLIC PANEL WITH GFR
Anion gap: 9 (ref 5–15)
BUN: 18 mg/dL (ref 6–20)
CO2: 25 mmol/L (ref 22–32)
Calcium: 9.2 mg/dL (ref 8.9–10.3)
Chloride: 109 mmol/L (ref 98–111)
Creatinine, Ser: 0.93 mg/dL (ref 0.44–1.00)
GFR, Estimated: >60 mL/min (ref >60)
Glucose, Bld: 110 mg/dL — ABNORMAL HIGH (ref 70–99)
Potassium: 4 mmol/L (ref 3.5–5.1)
Sodium: 143 mmol/L (ref 135–145)

## 2024-06-07 LAB — POC URINE PREG, ED: Preg Test, Ur: NEGATIVE

## 2024-06-07 MED ORDER — SODIUM CHLORIDE 0.9 % IV BOLUS
1000.0000 mL | Freq: Once | INTRAVENOUS | Status: AC
  Administered 2024-06-07: 1000 mL via INTRAVENOUS

## 2024-06-07 MED ORDER — KETOROLAC TROMETHAMINE 15 MG/ML IJ SOLN
15.0000 mg | Freq: Once | INTRAMUSCULAR | Status: AC
  Administered 2024-06-07: 15 mg via INTRAVENOUS
  Filled 2024-06-07: qty 1

## 2024-06-07 MED ORDER — ONDANSETRON 4 MG PO TBDP: 4.0000 mg | ORAL_TABLET | Freq: Three times a day (TID) | ORAL | 0 refills | Status: AC | PRN

## 2024-06-07 MED ORDER — ONDANSETRON HCL 4 MG/2ML IJ SOLN
4.0000 mg | Freq: Once | INTRAMUSCULAR | Status: AC
  Administered 2024-06-07: 4 mg via INTRAVENOUS
  Filled 2024-06-07: qty 2

## 2024-06-07 MED ORDER — OXYCODONE-ACETAMINOPHEN 5-325 MG PO TABS
1.0000 | ORAL_TABLET | Freq: Once | ORAL | Status: AC
  Administered 2024-06-07: 1 via ORAL
  Filled 2024-06-07: qty 1

## 2024-06-07 MED ORDER — OXYCODONE HCL 5 MG PO TABS: 5.0000 mg | ORAL_TABLET | Freq: Three times a day (TID) | ORAL | 0 refills | Status: AC | PRN

## 2024-06-07 NOTE — ED Notes (Signed)
Second urine specimen sent to the lab.  

## 2024-06-07 NOTE — Discharge Instructions (Addendum)
 You were seen today due to concern of back pain, I suspect you likely have passed a kidney stone which is resulting in your symptoms.  You may continue to feel intermittent spasms over the next few hours, but I suspect your symptoms should improve.  I have written for some nausea medication and some pain medication for you to take at home, please take these as instructed.  Please avoid driving or operating any heavy machinery while taking these medications.  If you have any worsening of symptoms such as uncontrolled pain uncontrolled nausea, fevers, or any other symptoms you find concerning please return to the emergency department immediately for further medical management

## 2024-06-07 NOTE — ED Triage Notes (Signed)
 Pt reports right side flank pain that began earlier today, pt denies dysuria. Pt was seen at [facility] earlier today and given abx and pain meds. Pt has hx kidney stones.

## 2024-06-07 NOTE — ED Notes (Signed)
 Iv started  meds given.

## 2024-06-07 NOTE — ED Notes (Signed)
 AVS provided by edp was reviewed with the pt. Prescriptions were reviewed. Pharmacy was verified by pt. Pt verbalized understanding with no additional questions at this time. Pt going home with daughter at bedside.

## 2024-06-07 NOTE — ED Provider Notes (Signed)
 [facility] Provider Note    Event Date/Time   First MD Initiated Contact with Patient 06/07/24 [phone]     (approximate)   History   Flank Pain   HPI  Katherine Zamora is a 48 y.o. female who presents today with concern of flank pain.  Developed right-sided flank pain earlier today, she went to urgent care was found with blood in her urine she was discharged home with an antibiotic and after she got a shot of some sort of pain medication.  She went home and went to sleep, when she woke up she was having excruciating pain along her right flank causing her to present to the emergency department.  She has had a kidney stone in the past about 2 years ago, she does not remember if this is what it felt like or not but she feels miserable right now.  Denies any other medical history no surgical history.     Physical Exam   Triage Vital Signs: ED Triage Vitals  Encounter Vitals Group     BP 06/07/24 0007 (!) 145/83     Girls Systolic BP Percentile --      Girls Diastolic BP Percentile --      Boys Systolic BP Percentile --      Boys Diastolic BP Percentile --      Pulse Rate 06/07/24 0007 79     Resp 06/07/24 0007 20     Temp 06/07/24 0007 98.1 F (36.7 C)     Temp src --      SpO2 06/07/24 0007 100 %     Weight 06/07/24 0006 140 lb (63.5 kg)     Height 06/07/24 0006 5' 9 (1.753 m)     Head Circumference --      Peak Flow --      Pain Score 06/07/24 0006 9     Pain Loc --      Pain Education --      Exclude from Growth Chart --     Most recent vital signs: Vitals:   06/07/24 0445 06/07/24 0526  BP:  104/68  Pulse: 68 65  Resp:  15  Temp:    SpO2: 95% 98%     General: Awake, appears very uncomfortable, writhing around in the stretcher CV:  Good peripheral perfusion.  Resp:  Normal effort.  Abd:  No distention.  Right CVA tenderness, and some minor discomfort to palpation along the right lower abdomen Other:     ED Results / Procedures /  Treatments   Labs (all labs ordered are listed, but only abnormal results are displayed) Labs Reviewed  BASIC METABOLIC PANEL WITH GFR - Abnormal; Notable for the following components:      Result Value   Glucose, Bld 110 (*)    All other components within normal limits  URINALYSIS, ROUTINE W REFLEX MICROSCOPIC - Abnormal; Notable for the following components:   Color, Urine YELLOW (*)    APPearance CLOUDY (*)    Specific Gravity, Urine 1.031 (*)    Hgb urine dipstick LARGE (*)    Ketones, ur 5 (*)    Protein, ur 30 (*)    Bacteria, UA FEW (*)    All other components within normal limits  CBC WITH DIFFERENTIAL/PLATELET  POC URINE PREG, ED     EKG     RADIOLOGY   PROCEDURES:  Critical Care performed: No  Procedures   MEDICATIONS ORDERED IN ED: Medications  oxyCODONE-acetaminophen (PERCOCET/ROXICET) 5-325  MG per tablet 1 tablet (1 tablet Oral Given 06/07/24 0012)  sodium chloride  0.9 % bolus 1,000 mL (0 mLs Intravenous Stopped 06/07/24 0355)  ketorolac  (TORADOL ) 15 MG/ML injection 15 mg (15 mg Intravenous Given 06/07/24 0134)  ondansetron  (ZOFRAN ) injection 4 mg (4 mg Intravenous Given 06/07/24 0134)     IMPRESSION / MDM / ASSESSMENT AND PLAN / ED COURSE  I reviewed the triage vital signs and the nursing notes.                               Patient's presentation is most consistent with acute complicated illness / injury requiring diagnostic workup.  48 year old female who presents today with concern of right-sided flank pain.  Began earlier today, apparently worked up at urgent care and informed that she likely has a kidney stone.  After pain medication worn off it seems that the pain has recurred.  She appears very uncomfortable here, her vitals fortunately are reassuring, she has significant right-sided flank pain.  CT of the abdomen pelvis does not reveal evidence of nephrolithiasis, however she continues to appear very uncomfortable possible that she has already  passed a kidney stone, but also possible other acute etiology.  Will attempt symptomatic management and follow-up urinalysis, if without improvement, may warrant further testing or even possibly admission for pain control.  Will follow-up labs imaging and determine further workup accordingly.   Clinical Course as of 06/07/24 0612  Tue Jun 07, 2024  0241 Patient much more comfortable and awake now, awaiting UA results, suspect likely passed kidney stone. [SK]  [id number] Patient urinalysis demonstrating blood but otherwise no evidence of a UTI.  She feels much more comfortable and improved at this time we will have her discharged home with a course of nausea medication as well as opiates.  Instructed to take ibuprofen as able. [SK]    Clinical Course User Index [SK] Fernand Rossie HERO, MD     FINAL CLINICAL IMPRESSION(S) / ED DIAGNOSES   Final diagnoses:  Left flank pain     Rx / DC Orders   ED Discharge Orders          Ordered    oxyCODONE (ROXICODONE) 5 MG immediate release tablet  Every 8 hours PRN        06/07/24 0526    ondansetron  (ZOFRAN -ODT) 4 MG disintegrating tablet  Every 8 hours PRN        06/07/24 0526             Note:  This document was prepared using Dragon voice recognition software and may include unintentional dictation errors.   Fernand Rossie HERO, MD 06/07/24 [phone]
